# Patient Record
Sex: Male | Born: 1975 | Race: Black or African American | Hispanic: No | Marital: Single | Smoking: Current every day smoker
Health system: Southern US, Community
[De-identification: ages and names within clinical notes are randomized; demographics above are authoritative.]

## PROBLEM LIST (undated history)

## (undated) DIAGNOSIS — A3790 Whooping cough, unspecified species without pneumonia: Secondary | ICD-10-CM

---

## 2013-07-20 ENCOUNTER — Emergency Department (HOSPITAL_BASED_OUTPATIENT_CLINIC_OR_DEPARTMENT_OTHER): Payer: Worker's Compensation

## 2013-07-20 ENCOUNTER — Emergency Department (HOSPITAL_BASED_OUTPATIENT_CLINIC_OR_DEPARTMENT_OTHER)
Admission: EM | Admit: 2013-07-20 | Discharge: 2013-07-20 | Disposition: A | Discharge status: Home / Self Care | Payer: Worker's Compensation | Attending: Emergency Medicine | Admitting: Emergency Medicine

## 2013-07-20 ENCOUNTER — Encounter (HOSPITAL_BASED_OUTPATIENT_CLINIC_OR_DEPARTMENT_OTHER): Payer: Self-pay | Admitting: Emergency Medicine

## 2013-07-20 DIAGNOSIS — H538 Other visual disturbances: Secondary | ICD-10-CM | POA: Insufficient documentation

## 2013-07-20 DIAGNOSIS — S02401A Maxillary fracture, unspecified, initial encounter for closed fracture: Principal | ICD-10-CM | POA: Insufficient documentation

## 2013-07-20 DIAGNOSIS — S02609A Fracture of mandible, unspecified, initial encounter for closed fracture: Secondary | ICD-10-CM | POA: Insufficient documentation

## 2013-07-20 DIAGNOSIS — F17200 Nicotine dependence, unspecified, uncomplicated: Secondary | ICD-10-CM | POA: Insufficient documentation

## 2013-07-20 DIAGNOSIS — S025XXA Fracture of tooth (traumatic), initial encounter for closed fracture: Secondary | ICD-10-CM | POA: Insufficient documentation

## 2013-07-20 DIAGNOSIS — Y939 Activity, unspecified: Secondary | ICD-10-CM | POA: Insufficient documentation

## 2013-07-20 DIAGNOSIS — S032XXA Dislocation of tooth, initial encounter: Secondary | ICD-10-CM

## 2013-07-20 DIAGNOSIS — S0240CA Maxillary fracture, right side, initial encounter for closed fracture: Secondary | ICD-10-CM

## 2013-07-20 DIAGNOSIS — S02400A Malar fracture unspecified, initial encounter for closed fracture: Secondary | ICD-10-CM | POA: Insufficient documentation

## 2013-07-20 DIAGNOSIS — IMO0002 Reserved for concepts with insufficient information to code with codable children: Secondary | ICD-10-CM | POA: Insufficient documentation

## 2013-07-20 DIAGNOSIS — Y9289 Other specified places as the place of occurrence of the external cause: Secondary | ICD-10-CM | POA: Insufficient documentation

## 2013-07-20 DIAGNOSIS — Y990 Civilian activity done for income or pay: Secondary | ICD-10-CM | POA: Insufficient documentation

## 2013-07-20 MED ORDER — OXYCODONE-ACETAMINOPHEN 5-325 MG PO TABS: 1.0000 | ORAL_TABLET | Freq: Four times a day (QID) | ORAL | Status: DC | PRN

## 2013-07-20 MED ORDER — MORPHINE SULFATE 4 MG/ML IJ SOLN
4.0000 mg | Freq: Once | INTRAMUSCULAR | Status: AC
  Administered 2013-07-20: 4 mg via INTRAMUSCULAR
  Filled 2013-07-20: qty 1

## 2013-07-20 NOTE — ED Notes (Signed)
Patient had a metal bar hit him on the right side of his face yesterday at work,  [facility] and sent home with a dx of toothpain.  Patients right teeth are knocked inward and chipped as a result of accident. Right side of face is swollen, nose and right eye also swollen. Nose feel swollen, but pt is able to breath well. Patient reports a blind spot in his right eye that is new for him.

## 2013-07-20 NOTE — ED Provider Notes (Signed)
CSN: [id number]     Arrival date & time 07/20/13  1334 History   First MD Initiated Contact with Patient 07/20/13 1337     Chief Complaint  Patient presents with  . Facial Injury   (Consider location/radiation/quality/duration/timing/severity/associated sxs/prior Treatment) HPI Comments: Pt is a 38 y/o male who presents to the ED complaining of worsening facial pain after being hit on the right side of his face with a metal bar while at work yesterday around 9:00 am. He went to [facility] hospital and was told he had tooth pain, sent home with penicillin and vicodin. Pt states his face has become swollen, his teeth are loose and has a quick episode of blurred vision in his right eye only when he looks to the right. Denies confusion, difficulty breathing, weakness, ear drainage, eye pain or eye movement pain.  Patient is a 38 y.o. male presenting with facial injury. The history is provided by the patient.  Facial Injury   History reviewed. No pertinent past medical history. History reviewed. No pertinent past surgical history. History reviewed. No pertinent family history. History  Substance Use Topics  . Smoking status: Current Every Day Smoker  . Smokeless tobacco: Not on file  . Alcohol Use: Yes     Comment: social    Review of Systems  HENT: Positive for dental problem and facial swelling.   Eyes: Positive for visual disturbance.  Skin: Positive for wound.  All other systems reviewed and are negative.    Allergies  Review of patient's allergies indicates not on file.  Home Medications   Current Outpatient Rx  Name  Route  Sig  Dispense  Refill  . oxyCODONE-acetaminophen (PERCOCET) 5-325 MG per tablet   Oral   Take 1-2 tablets by mouth every 6 (six) hours as needed for severe pain.   15 tablet   0    BP 145/84  Pulse 87  Temp(Src) 98.2 F (36.8 C) (Oral)  Resp 20  Ht 5\' 10"  (1.778 m)  Wt 160 lb (72.576 kg)  BMI 22.96 kg/m2  SpO2 100% Physical Exam  Nursing note and  vitals reviewed. Constitutional: He is oriented to person, place, and time. He appears well-developed and well-nourished. No distress.  HENT:  Head: Head is without raccoon's eyes and without Battle's sign.    Right Ear: No hemotympanum.  Left Ear: No hemotympanum.  Nose: No sinus tenderness, nasal deformity or septal deviation.  Mouth/Throat: Oropharynx is clear and moist.    Mild right sided facial swelling. No tenderness around orbits. 4 upper right teeth marked in graphical image loose and tender. Nares patent.  Eyes: Conjunctivae and EOM are normal. Pupils are equal, round, and reactive to light.  No pain with eye movement.  Neck: Normal range of motion. Neck supple.  Cardiovascular: Normal rate, regular rhythm and normal heart sounds.   Pulmonary/Chest: Effort normal and breath sounds normal. No respiratory distress.  Musculoskeletal: Normal range of motion. He exhibits no edema.  Neurological: He is alert and oriented to person, place, and time.  Skin: Skin is warm and dry. He is not diaphoretic.  Psychiatric: He has a normal mood and affect. His behavior is normal.    ED Course  Procedures (including critical care time) Labs Review Labs Reviewed - No data to display Imaging Review Ct Maxillofacial Wo Cm  07/20/2013   CLINICAL DATA:  Right-sided facial trauma, visual field disturbance and restricted range of maxillary motion.  EXAM: CT MAXILLOFACIAL WITHOUT CONTRAST  TECHNIQUE: Multidetector CT imaging  of the maxillofacial structures was performed. Multiplanar CT image reconstructions were also generated. A small metallic BB was placed on the right temple in order to reliably differentiate right from left.  COMPARISON:  None.  FINDINGS: Dental caries noted. There is an oblique fracture traversing the right antral lateral aspect of the maxilla, for example image 49. Dental malocclusion is noted. Nasal bones are unremarkable. Zygomatic arches and orbits are intact. Mild ethmoid and  maxillary sinusitis is noted. Mandibular condyles are properly located. Patient is partly edentulous. There is also a nondisplaced oblique fracture of the right antral lateral mandible. This is best seen on coronal suspect this is best seen on sagittal reformatted images for example image 37.  IMPRESSION: Anterolateral right maxillary fracture with partial avulsion of multiple teeth.  Oblique fracture through the anterior lateral right mandible.  Patient is partly edentulous with malocclusion and severe dental caries.  Sinusitis.   Electronically Signed   By: Christiana PellantGretchen  Green M.D.   On: 07/20/2013 14:35    EKG Interpretation   None       MDM   1. Closed fracture of right maxilla   2. Avulsion of tooth   3. Fracture of mandible, closed    Patient presenting with right-sided facial injury after a metal pole fell onto his face. Seen at [facility] yesterday, had a normal orthopantogram. CT maxillofacial showing anterolateral right maxillary fracture with partial avulsion of multiple teeth, oblique fracture through the anterior lateral right mandible. I spoke with Dr. Jeanice Limurham, on call for maxillofacial trauma who will see patient in the office tomorrow, advised pain medication and antibiotics. [facility] which Dr. Jeanice Limurham states is significant enough. Advised soft foods diet. Patient is stable for discharge. Return precautions given. Patient states understanding of treatment care plan and is agreeable. Case discussed with attending Dr. Bernette MayersSheldon who agrees with plan of care.     Trevor MaceRobyn M Albert, PA-C 07/20/13 1544

## 2013-07-20 NOTE — Discharge Instructions (Signed)
Take percocet for severe pain only. No driving or operating heavy machinery while taking percocet. This medication may cause drowsiness. Call Dr. Deirdre Peerurham's office tomorrow at 8:00 am, he is expecting you in the office.  Mandibular Fracture A mandibular fracture is a break in the jawbone. CAUSES  The most common cause of mandibular fracture is a direct blow (trauma) to the jaw. This could happen from:  A car crash.  Physical violence.  A fall from a high place. SYMPTOMS   Pain.  Swelling.  Difficulty and pain when closing the mouth.  Feeling that the teeth are not aligned properly when closing the mouth (malocclusion).  Difficulty speaking.  Difficulty swallowing. DIAGNOSIS  Your caregiver will take your history and perform a physical exam. He or she may also order imaging tests, such as X-rays or a computed tomography (CT) scan, to confirm your diagnosis. TREATMENT  Surgery is often needed to put the jaw back in the right position. Wires are usually placed around the teeth to hold the jaw in place while it heals. Treatment may also include pain medicine, ice, and a soft or liquid diet. HOME CARE INSTRUCTIONS   Put ice on the injured area.  Put ice in a plastic bag.  Place a towel between your skin and the bag.  Leave the ice on for 15-20 minutes, 03-04 times a day for the first 2 days.  Only take over-the-counter or prescription medicines for pain, discomfort, or fever as directed by your caregiver.  Eat a well-balanced, high-protein soft or liquid diet as directed by your caregiver.  If your jaws are wired, follow your caregiver's instructions for wired jaw care.  Sleep on your back to avoid putting pressure on your jaw.  Avoid exercising to the point that you become short of breath. SEEK MEDICAL CARE IF:   You have a severe headache or numbness in your face.  You have severe jaw pain that is not relieved with medicine.  Your jaw wires become loose.  You have  uncontrollable nausea or anxiety.  Your swelling or redness gets worse. SEEK IMMEDIATE MEDICAL CARE IF:  You have a fever.  You have difficulty breathing.  You feel like your airway is tightening.  You cannot swallow your saliva.  You make a high-pitched whistling sound when you breathe (wheezing). MAKE SURE YOU:   Understand these instructions.  Will watch your condition.  Will get help right away if you are not doing well or get worse. Document Released: 06/12/2005 Document Revised: 09/04/2011 Document Reviewed: 06/28/2011 [facility] Patient Information 2014 [place], [place].  Facial Fracture A facial fracture is a break in one of the bones of your face. HOME CARE INSTRUCTIONS   Protect the injured part of your face until it is healed.  Do not participate in activities which give chance for re-injury until your doctor approves.  Gently wash and dry your face.  Wear head and facial protection while riding a bicycle, motorcycle, or snowmobile. SEEK MEDICAL CARE IF:   An oral temperature above 102 F (38.9 C) develops.  You have severe headaches or notice changes in your vision.  You have new numbness or tingling in your face.  You develop nausea (feeling sick to your stomach), vomiting or a stiff neck. SEEK IMMEDIATE MEDICAL CARE IF:   You develop difficulty seeing or experience double vision.  You become dizzy, lightheaded, or faint.  You develop trouble speaking, breathing, or swallowing.  You have a watery discharge from your nose or ear. MAKE  SURE YOU:   Understand these instructions.  Will watch your condition.  Will get help right away if you are not doing well or get worse. Document Released: 06/12/2005 Document Revised: 09/04/2011 Document Reviewed: 01/30/2008 [facility] Patient Information 2014 [place], [place].  Dental Injury Your exam shows that you have injured your teeth. The treatment of broken teeth and other dental injuries depends on how  badly they are hurt. All dental injuries should be checked as soon as possible by a dentist if there are:  Loose teeth which may need to be wired or bonded with a plastic device to hold them in place.  Broken teeth with exposed tooth pulp which may cause a serious infection.  Painful teeth especially when you bite or chew.  Sharp tooth edges that cut your tongue or lips. Sometimes, antibiotics or pain medicine are prescribed to prevent infection and control pain. Eat a soft or liquid diet and rinse your mouth out after meals with warm water. You should see a dentist or return here at once if you have increased swelling, increased pain or uncontrolled bleeding from the site of your injury. SEEK MEDICAL CARE IF:   You have increased pain not controlled with medicines.  You have swelling around your tooth, in your face or neck.  You have bleeding which starts, continues, or gets worse.  You have a fever. Document Released: 06/12/2005 Document Revised: 09/04/2011 Document Reviewed: 06/11/2009 [facility] Patient Information 2014 [place], [place].

## 2013-07-22 NOTE — ED Provider Notes (Signed)
Medical screening examination/treatment/procedure(s) were performed by non-physician practitioner and as supervising physician I was immediately available for consultation/collaboration.  EKG Interpretation   None         Domique Clapper B. Bobi Daudelin, MD 07/22/13 2036 

## 2013-07-27 ENCOUNTER — Encounter (HOSPITAL_BASED_OUTPATIENT_CLINIC_OR_DEPARTMENT_OTHER): Payer: Self-pay | Admitting: Emergency Medicine

## 2013-07-27 ENCOUNTER — Emergency Department (HOSPITAL_BASED_OUTPATIENT_CLINIC_OR_DEPARTMENT_OTHER)
Admission: EM | Admit: 2013-07-27 | Discharge: 2013-07-27 | Disposition: A | Discharge status: Home / Self Care | Payer: Worker's Compensation | Attending: Emergency Medicine | Admitting: Emergency Medicine

## 2013-07-27 DIAGNOSIS — F17200 Nicotine dependence, unspecified, uncomplicated: Secondary | ICD-10-CM | POA: Insufficient documentation

## 2013-07-27 DIAGNOSIS — K5900 Constipation, unspecified: Secondary | ICD-10-CM

## 2013-07-27 MED ORDER — MAGNESIUM CITRATE PO SOLN: 1.0000 | Freq: Once | ORAL | Status: DC

## 2013-07-27 MED ORDER — FLEET ENEMA 7-19 GM/118ML RE ENEM
1.0000 | ENEMA | Freq: Once | RECTAL | Status: DC
  Filled 2013-07-27: qty 1

## 2013-07-27 NOTE — ED Provider Notes (Signed)
History/physical exam/procedure(s) were performed by non-physician practitioner and as supervising physician I was immediately available for consultation/collaboration. I have reviewed all notes and am in agreement with care and plan.   Hilario Quarryanielle S Anntonette Madewell, MD 07/27/13 1537

## 2013-07-27 NOTE — Discharge Instructions (Signed)
Constipation, Adult °Constipation is when a person: °· Poops (bowel movement) less than 3 times a week. °· Has a hard time pooping. °· Has poop that is dry, hard, or bigger than normal. °HOME CARE  °· Eat more fiber, such as fruits, vegetables, whole grains like brown rice, and beans. °· Eat less fatty foods and sugar. This includes French fries, hamburgers, cookies, candy, and soda. °· If you are not getting enough fiber from food, take products with added fiber in them (supplements). °· Drink enough fluid to keep your pee (urine) clear or pale yellow. °· Go to the restroom when you feel like you need to poop. Do not hold it. °· Only take medicine as told by your doctor. Do not take medicines that help you poop (laxatives) without talking to your doctor first. °· Exercise on a regular basis, or as told by your doctor. °GET HELP RIGHT AWAY IF:  °· You have bright red blood in your poop (stool). °· Your constipation lasts more than 4 days or gets worse. °· You have belly (abdomen) or butt (rectal) pain. °· You have thin poop (as thin as a pencil). °· You lose weight, and it cannot be explained. °MAKE SURE YOU:  °· Understand these instructions. °· Will watch your condition. °· Will get help right away if you are not doing well or get worse. °Document Released: 11/29/2007 Document Revised: 09/04/2011 Document Reviewed: 03/24/2013 °ExitCare® Patient Information ©2014 [employer], LLC. ° °Fiber Content in Foods °Drinking plenty of fluids and consuming foods high in fiber can help with constipation. See the list below for the fiber content of some common foods. °Starches and Grains / Dietary Fiber (g) °· Cheerios, 1 cup / 3 g °· Kellogg's Corn Flakes, 1 cup / 0.7 g °· Rice Krispies, 1 ¼ cup / 0.3 g °· Quaker Oat Life Cereal, ¾ cup / 2.1 g °· Oatmeal, instant (cooked), ½ cup / 2 g °· Kellogg's Frosted Mini Wheats, 1 cup / 5.1 g °· Rice, brown, long-grain (cooked), 1 cup / 3.5 g °· Rice, white, long-grain (cooked), 1 cup /  0.6 g °· Macaroni, cooked, enriched, 1 cup / 2.5 g °Legumes / Dietary Fiber (g) °· Beans, baked, canned, plain or vegetarian, ½ cup / 5.2 g °· Beans, kidney, canned, ½ cup / 6.8 g °· Beans, pinto, dried (cooked), ½ cup / 7.7 g °· Beans, pinto, canned, ½ cup / 5.5 g °Breads and Crackers / Dietary Fiber (g) °· Graham crackers, plain or honey, 2 squares / 0.7 g °· Saltine crackers, 3 squares / 0.3 g °· Pretzels, plain, salted, 10 pieces / 1.8 g °· Bread, whole-wheat, 1 slice / 1.9 g °· Bread, white, 1 slice / 0.7 g °· Bread, raisin, 1 slice / 1.2 g °· Bagel, plain, 3 oz / 2 g °· Tortilla, flour, 1 oz / 0.9 g °· Tortilla, corn, 1 small / 1.5 g °· Bun, hamburger or hotdog, 1 small / 0.9 g °Fruits / Dietary Fiber (g) °· Apple, raw with skin, 1 medium / 4.4 g °· Applesauce, sweetened, ½ cup / 1.5 g °· Banana, ½ medium / 1.5 g °· Grapes, 10 grapes / 0.4 g °· Orange, 1 small / 2.3 g °· Raisin, 1.5 oz / 1.6 g °· Melon, 1 cup / 1.4 g °Vegetables / Dietary Fiber (g) °· Green beans, canned, ½ cup / 1.3 g °· Carrots (cooked), ½ cup / 2.3 g °· Broccoli (cooked), ½ cup / 2.8 g °· Peas,   frozen (cooked), ½ cup / 4.4 g °· Potatoes, mashed, ½ cup / 1.6 g °· Lettuce, 1 cup / 0.5 g °· Corn, canned, ½ cup / 1.6 g °· Tomato, ½ cup / 1.1 g °Document Released: 10/29/2006 Document Revised: 09/04/2011 Document Reviewed: 12/24/2006 °ExitCare® Patient Information ©2014 ExitCare, LLC. ° °

## 2013-07-27 NOTE — ED Provider Notes (Signed)
CSN: [id number]     Arrival date & time 07/27/13  1115 History   First MD Initiated Contact with Patient 07/27/13 1127     Chief Complaint  Patient presents with  . Constipation   (Consider location/radiation/quality/duration/timing/severity/associated sxs/prior Treatment) Patient is a 38 y.o. male presenting with constipation. The history is provided by the patient.  Constipation Severity:  Mild Time since last bowel movement:  1 week Timing:  Constant Progression:  Worsening Chronicity:  New Context: medication   Stool description:  None produced Unusual stool frequency:  Daily Relieved by:  None tried Worsened by:  Prescription drugs Ineffective treatments:  None tried Associated symptoms: no anorexia, no back pain, no diarrhea, no dysuria, no fever, no nausea and no vomiting  Abdominal pain: mild cramping.    COVEY BALLER is a 38 y.o. male who presents to the ED with constipation that started after he was taking narcotic pain medication for a facial injury. He has not had a BM in 6 days. He has not taken a stool softener or anything to try to help with the constipation.  History reviewed. No pertinent past medical history. History reviewed. No pertinent past surgical history. No family history on file. History  Substance Use Topics  . Smoking status: Current Every Day Smoker  . Smokeless tobacco: Not on file  . Alcohol Use: Yes     Comment: social    Review of Systems  Constitutional: Negative for fever and chills.  Gastrointestinal: Positive for constipation. Negative for nausea, vomiting, diarrhea and anorexia. Abdominal pain: mild cramping.  Genitourinary: Negative for dysuria.  Musculoskeletal: Negative for back pain.  Skin: Negative for rash.  Neurological: Negative for dizziness.  Psychiatric/Behavioral: The patient is not nervous/anxious.     Allergies  Review of patient's allergies indicates no known allergies.  Home Medications   Current Outpatient Rx   Name  Route  Sig  Dispense  Refill  . oxyCODONE-acetaminophen (PERCOCET) 5-325 MG per tablet   Oral   Take 1-2 tablets by mouth every 6 (six) hours as needed for severe pain.   15 tablet   0    BP 115/71  Pulse 72  Temp(Src) 98 F (36.7 C) (Oral)  Resp 18  SpO2 100% Physical Exam  Nursing note and vitals reviewed. Constitutional: He is oriented to person, place, and time. He appears well-developed and well-nourished. No distress.  Eyes: EOM are normal.  Neck: Neck supple.  Cardiovascular: Normal rate and regular rhythm.   Pulmonary/Chest: Effort normal. He has no wheezes. He has no rales.  Abdominal: Soft. Bowel sounds are normal. There is no tenderness.  Genitourinary: Rectal exam shows no external hemorrhoid, no internal hemorrhoid, no mass and no tenderness.  Large amount of stool palpated on exam.   Musculoskeletal: Normal range of motion.  Neurological: He is alert and oriented to person, place, and time. No cranial nerve deficit.  Skin: Skin is warm and dry.  Psychiatric: He has a normal mood and affect. His behavior is normal.    ED Course  Procedures ( Offered Fleet enema but patient declined. Request trial of OTC medication first.  MDM  38 y.o. male with constipation x 1 week after taking oral narcotic pain medication. Will treat with medication and diet. He will return as needed if symptoms worsen.  Discussed with the patient and all questioned fully answered.   Medication List    TAKE these medications       magnesium citrate Soln  Take 296 mLs (  1 Bottle total) by mouth once.      ASK your doctor about these medications       oxyCODONE-acetaminophen 5-325 MG per tablet  Commonly known as:  PERCOCET  Take 1-2 tablets by mouth every 6 (six) hours as needed for severe pain.         [facility] Orlene OchM [place], [place]NP 07/27/13 1230

## 2013-07-27 NOTE — ED Notes (Addendum)
Patient stated that he would like to try something by mouth before getting an enema. NP made aware.

## 2013-07-27 NOTE — ED Notes (Signed)
Patient was here last week for facial injuries and was prescribed pain medication. States that he has not a bowel movement since Monday.

## 2014-02-09 IMAGING — CT CT MAXILLOFACIAL W/O CM
3 series · 16 of 47 positions shown, 19 images · non-contrast
Comparison: None.

CLINICAL DATA: Right-sided facial trauma, visual field disturbance
and restricted range of maxillary motion.

EXAM:
CT MAXILLOFACIAL WITHOUT CONTRAST
TECHNIQUE: Multidetector CT imaging of the maxillofacial structures was
performed. Multiplanar CT image reconstructions were also generated.
A small metallic BB was placed on the right temple in order to
reliably differentiate right from left.

[Series 3: maxillofacial 2.0 h30s st · axial · 0.38mm/px · z∈[-222,-46]mm · 10 of 102 slices shown, 13 images]
[im 7/102  brain]
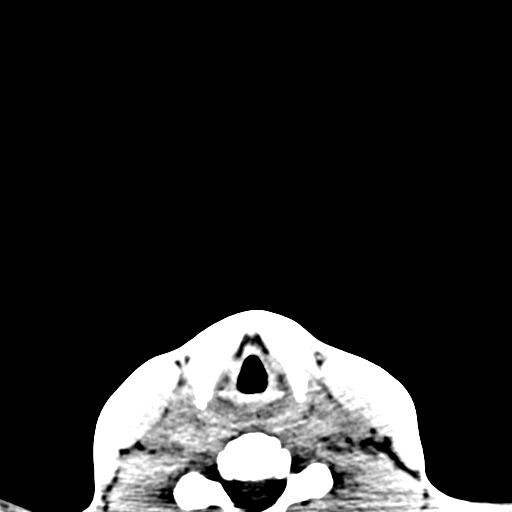
[im 7/102  bone]
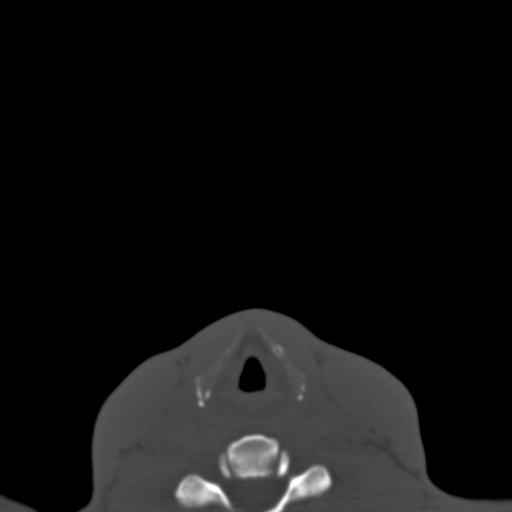
[im 18/102  bone]
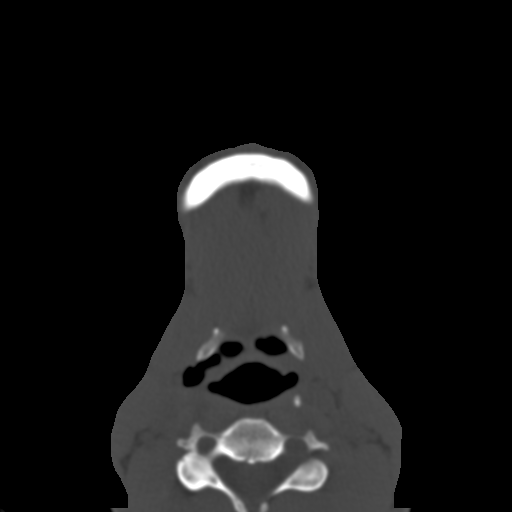
[im 28/102  bone]
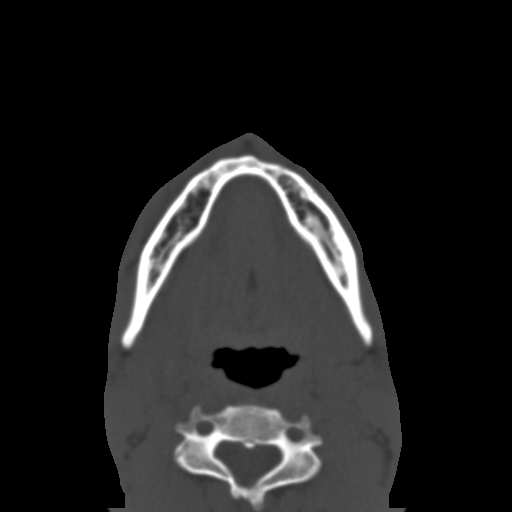
[im 35/102  bone]
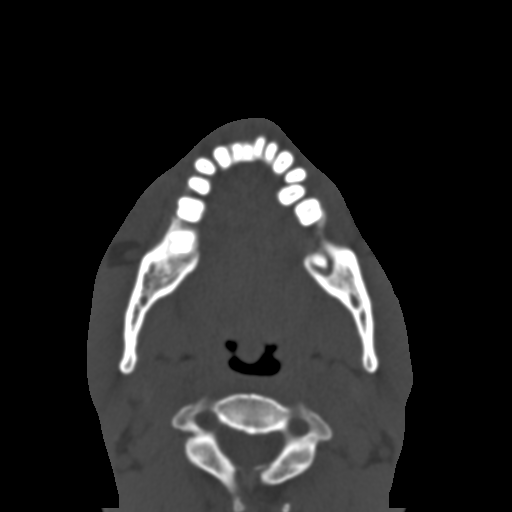
[im 46/102  brain]
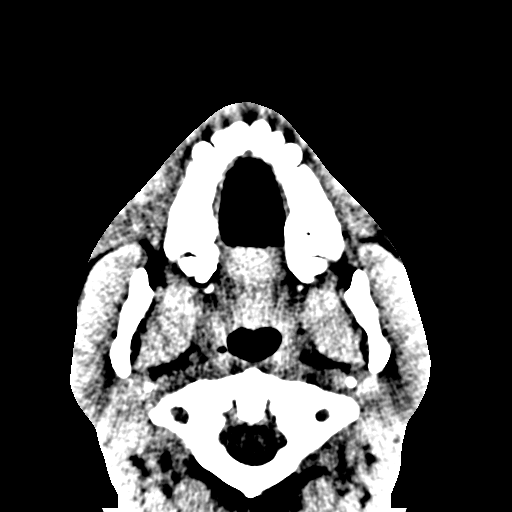
[im 46/102  bone]
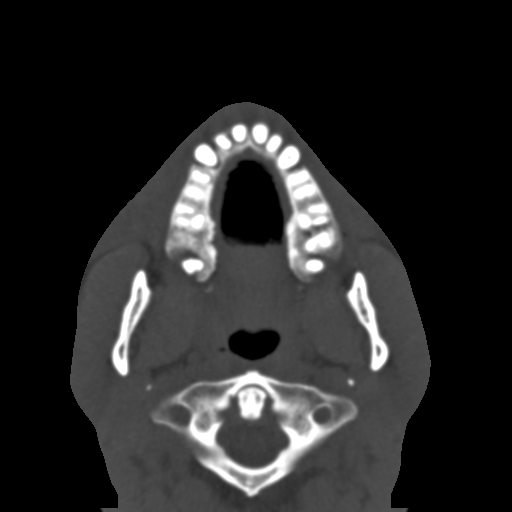
[im 56/102  bone]
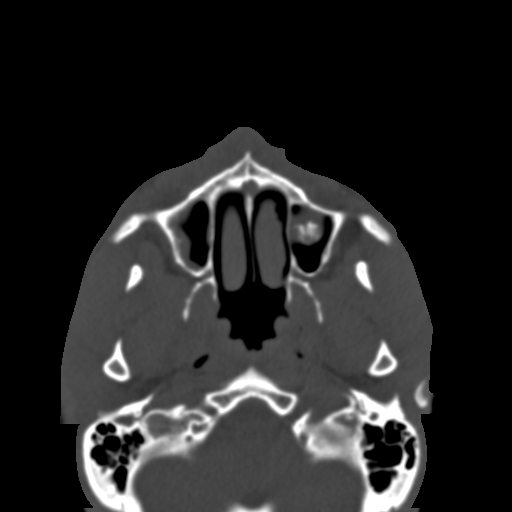
[im 67/102  bone]
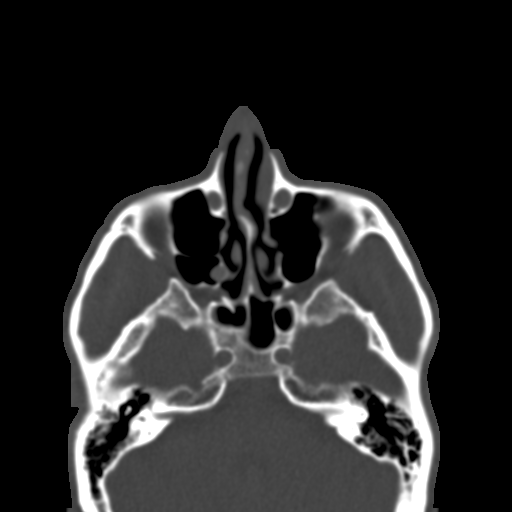
[im 77/102  bone]
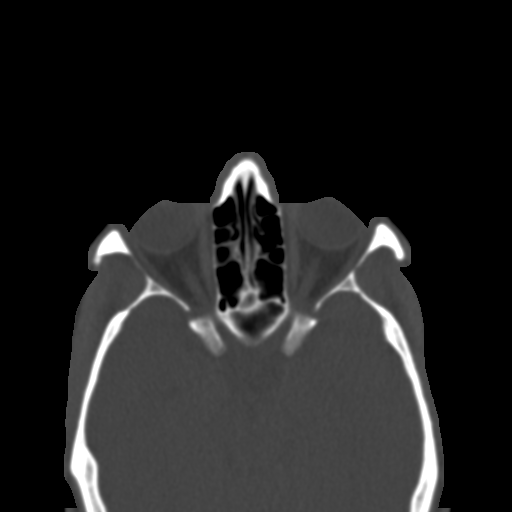
[im 84/102  brain]
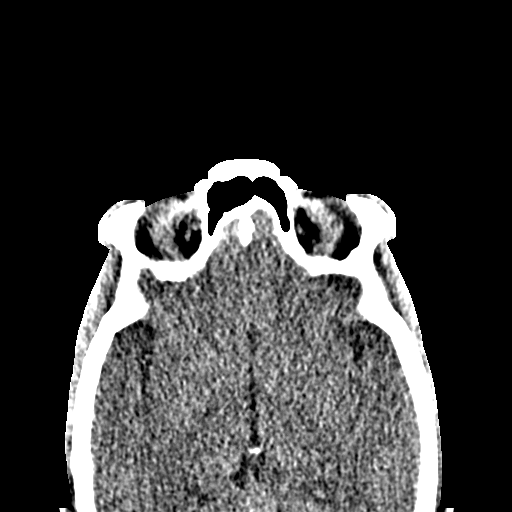
[im 84/102  bone]
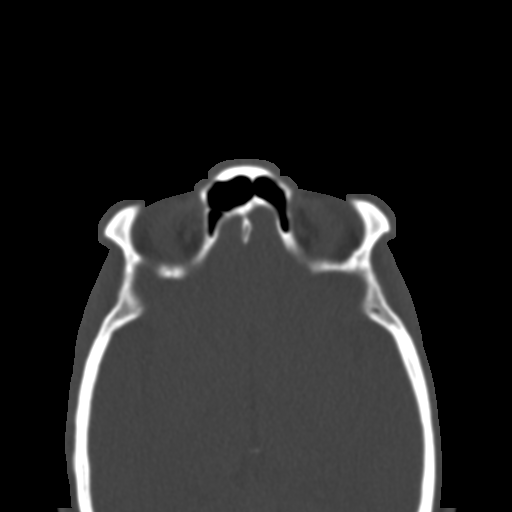
[im 95/102  bone]
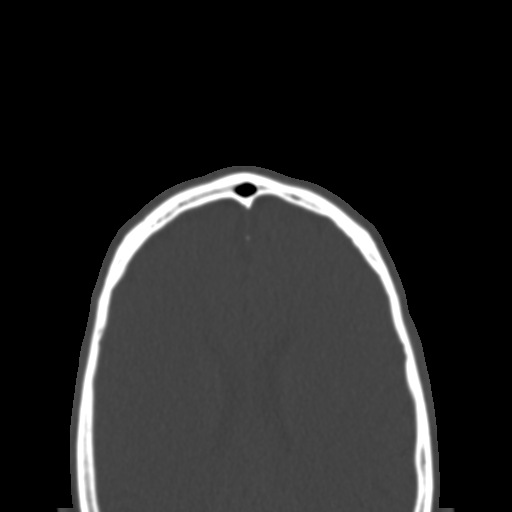

[Series 8: maxillofacial 2.0 coronal · coronal · 0.46mm/px · 3 of 80 slices shown]
[im 27/80  bone]
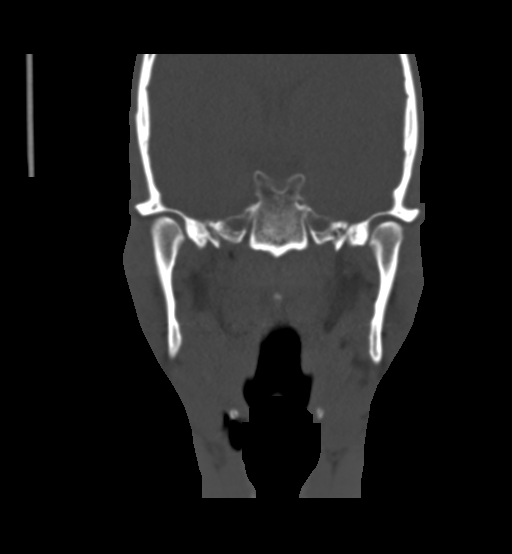
[im 36/80  bone]
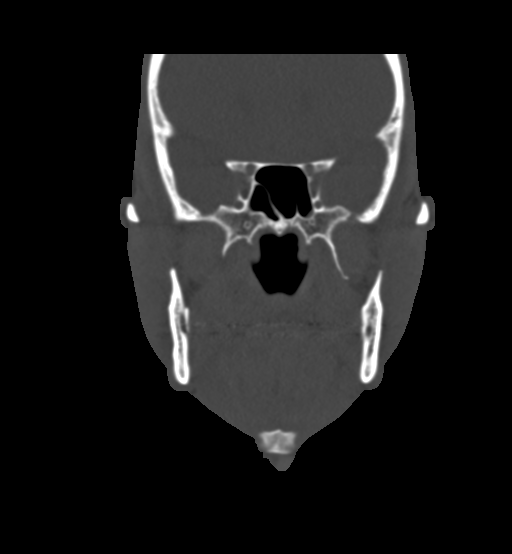
[im 44/80  bone]
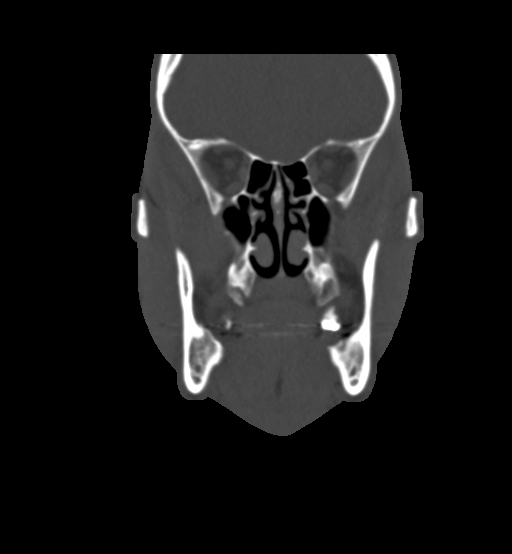

[Series 9: maxillofacial 2.0 sagittal · sagittal · 0.51mm/px · 3 of 79 slices shown]
[im 27/79  bone]
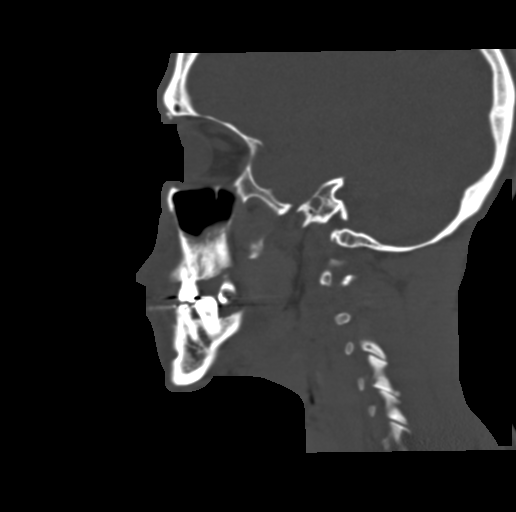
[im 40/79  bone]
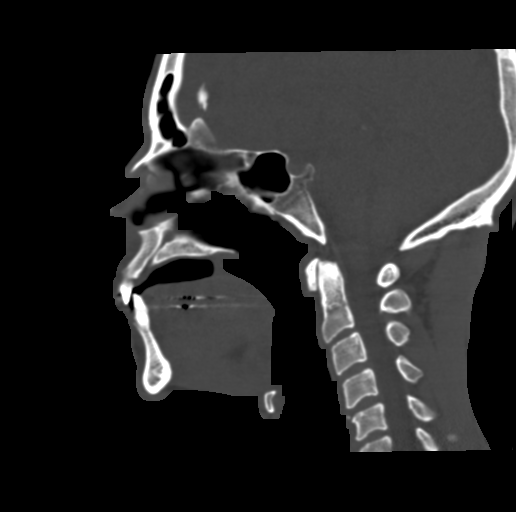
[im 53/79  bone]
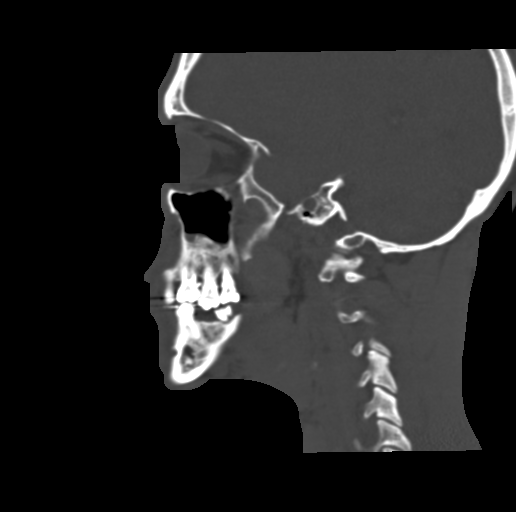

[16 of 47 positions shown; findings below may reference images not displayed]

FINDINGS: Dental caries noted. There is an oblique fracture traversing the
right antral lateral aspect of the maxilla, for example image 49.
Dental malocclusion is noted. Nasal bones are unremarkable.
Zygomatic arches and orbits are intact. Mild ethmoid and maxillary
sinusitis is noted. Mandibular condyles are properly located.
Patient is partly edentulous. There is also a nondisplaced oblique
fracture of the right antral lateral mandible. This is best seen on
coronal suspect this is best seen on sagittal reformatted images for
example image 37.
IMPRESSION: Anterolateral right maxillary fracture with partial avulsion of
multiple teeth.

Oblique fracture through the anterior lateral right mandible.

Patient is partly edentulous with malocclusion and severe dental
caries.

Sinusitis.

## 2014-03-04 ENCOUNTER — Encounter (HOSPITAL_BASED_OUTPATIENT_CLINIC_OR_DEPARTMENT_OTHER): Payer: Self-pay | Admitting: Emergency Medicine

## 2014-03-04 ENCOUNTER — Emergency Department (HOSPITAL_BASED_OUTPATIENT_CLINIC_OR_DEPARTMENT_OTHER)
Admission: EM | Admit: 2014-03-04 | Discharge: 2014-03-04 | Disposition: A | Discharge status: Home / Self Care | Payer: BC Managed Care – PPO | Attending: Emergency Medicine | Admitting: Emergency Medicine

## 2014-03-04 DIAGNOSIS — K589 Irritable bowel syndrome without diarrhea: Secondary | ICD-10-CM | POA: Insufficient documentation

## 2014-03-04 DIAGNOSIS — R109 Unspecified abdominal pain: Secondary | ICD-10-CM | POA: Diagnosis present

## 2014-03-04 DIAGNOSIS — F17200 Nicotine dependence, unspecified, uncomplicated: Secondary | ICD-10-CM | POA: Diagnosis not present

## 2014-03-04 LAB — CBC WITH DIFFERENTIAL/PLATELET
BASOS PCT: 0 % (ref 0–1)
Basophils Absolute: 0 10*3/uL (ref 0.0–0.1)
Eosinophils Absolute: 0.1 10*3/uL (ref 0.0–0.7)
Eosinophils Relative: 1 % (ref 0–5)
HCT: 39.4 % (ref 39.0–52.0)
HEMOGLOBIN: 13.5 g/dL (ref 13.0–17.0)
LYMPHS PCT: 21 % (ref 12–46)
Lymphs Abs: 2 10*3/uL (ref 0.7–4.0)
MCH: 27.5 pg (ref 26.0–34.0)
MCHC: 34.3 g/dL (ref 30.0–36.0)
MCV: 80.2 fL (ref 78.0–100.0)
MONO ABS: 0.6 10*3/uL (ref 0.1–1.0)
MONOS PCT: 7 % (ref 3–12)
NEUTROS ABS: 6.6 10*3/uL (ref 1.7–7.7)
NEUTROS PCT: 71 % (ref 43–77)
Platelets: 252 10*3/uL (ref 150–400)
RBC: 4.91 MIL/uL (ref 4.22–5.81)
RDW: 15.3 % (ref 11.5–15.5)
WBC: 9.2 10*3/uL (ref 4.0–10.5)

## 2014-03-04 LAB — COMPREHENSIVE METABOLIC PANEL
ALBUMIN: 4.1 g/dL (ref 3.5–5.2)
ALK PHOS: 48 U/L (ref 39–117)
ALT: 11 U/L (ref 0–53)
ANION GAP: 11 (ref 5–15)
AST: 16 U/L (ref 0–37)
BUN: 13 mg/dL (ref 6–23)
CO2: 28 mEq/L (ref 19–32)
Calcium: 9.6 mg/dL (ref 8.4–10.5)
Chloride: 101 mEq/L (ref 96–112)
Creatinine, Ser: 1.1 mg/dL (ref 0.50–1.35)
GFR calc Af Amer: >90 mL/min (ref >90)
GFR calc non Af Amer: 82 mL/min — ABNORMAL LOW (ref >90)
Glucose, Bld: 91 mg/dL (ref 70–99)
POTASSIUM: 4 meq/L (ref 3.7–5.3)
Sodium: 140 mEq/L (ref 137–147)
Total Bilirubin: 0.5 mg/dL (ref 0.3–1.2)
Total Protein: 7 g/dL (ref 6.0–8.3)

## 2014-03-04 MED ORDER — PROMETHAZINE HCL 25 MG PO TABS: 25.0000 mg | ORAL_TABLET | Freq: Four times a day (QID) | ORAL | Status: DC | PRN

## 2014-03-04 NOTE — ED Provider Notes (Signed)
CSN: [id number]     Arrival date & time 03/04/14  1112 History   First MD Initiated Contact with Patient 03/04/14 1124     Chief Complaint  Patient presents with  . Abdominal Cramping     HPI Patient presents with some abdominal cramping nausea and loose runny bowel movements for the last week or 2.  Patient denies any constipation.  Denies fever chills.  Denies melena or hematochezia.    History reviewed. No pertinent past medical history. History reviewed. No pertinent past surgical history. No family history on file. History  Substance Use Topics  . Smoking status: Current Every Day Smoker  . Smokeless tobacco: Not on file  . Alcohol Use: Yes     Comment: social    Review of Systems  All other systems reviewed and are negative   Allergies  Review of patient's allergies indicates no known allergies.  Home Medications   Prior to Admission medications   Medication Sig Start Date End Date Taking? Authorizing Provider  magnesium citrate SOLN Take 296 mLs (1 Bottle total) by mouth once. 07/27/13   Hope Orlene Och, NP  oxyCODONE-acetaminophen (PERCOCET) 5-325 MG per tablet Take 1-2 tablets by mouth every 6 (six) hours as needed for severe pain. 07/20/13   Trevor Mace, PA-C  promethazine (PHENERGAN) 25 MG tablet Take 1 tablet (25 mg total) by mouth every 6 (six) hours as needed for nausea or vomiting. 03/04/14   Nelia Shi, MD   BP 109/69  Pulse 85  Temp(Src) 97.9 F (36.6 C) (Oral)  Resp 18  Ht  (1.778 m)  Wt 157 lb (71.215 kg)  BMI 22.53 kg/m2  SpO2 100% Physical Exam Physical Exam  Nursing note and vitals reviewed. Constitutional: He is oriented to person, place, and time. He appears well-developed and well-nourished. No distress.  HENT:  Head: Normocephalic and atraumatic.  Eyes: Pupils are equal, round, and reactive to light.  Neck: Normal range of motion.  Cardiovascular: Normal rate and intact distal pulses.   Pulmonary/Chest: No respiratory distress.   Abdominal: Normal appearance. He exhibits no distension.  Musculoskeletal: Normal range of motion.  Neurological: He is alert and oriented to person, place, and time. No cranial nerve deficit.  Skin: Skin is warm and dry. No rash noted.  Psychiatric: He has a normal mood and affect. His behavior is normal.   ED Course  Procedures (including critical care time) Labs Review Labs Reviewed  COMPREHENSIVE METABOLIC PANEL - Abnormal; Notable for the following:    GFR calc non Af Amer 82 (*)    All other components within normal limits  CBC WITH DIFFERENTIAL         MDM   Final diagnoses:  Irritable bowel        Nelia Shi, MD 03/04/14 1221

## 2014-03-04 NOTE — Discharge Instructions (Signed)
Diet and Irritable Bowel Syndrome  No cure has been found for irritable bowel syndrome (IBS). Many options are available to treat the symptoms. Your caregiver will give you the best treatments available for your symptoms. He or she will also encourage you to manage stress and to make changes to your diet. You need to work with your caregiver and Registered Dietician to find the best combination of medicine, diet, counseling, and support to control your symptoms. The following are some diet suggestions. FOODS THAT MAKE IBS WORSE  Fatty foods, such as [place] fries.  Milk products, such as cheese or ice cream.  Chocolate.  Alcohol.  Caffeine (found in coffee and some sodas).  Carbonated drinks, such as soda. If certain foods cause symptoms, you should eat less of them or stop eating them. FOOD JOURNAL   Keep a journal of the foods that seem to cause distress. Write down:  What you are eating during the day and when.  What problems you are having after eating.  When the symptoms occur in relation to your meals.  What foods always make you feel badly.  Take your notes with you to your caregiver to see if you should stop eating certain foods. FOODS THAT MAKE IBS BETTER Fiber reduces IBS symptoms, especially constipation, because it makes stools soft, bulky, and easier to pass. Fiber is found in bran, bread, cereal, beans, fruit, and vegetables. Examples of foods with fiber include:  FIBER ONE CEREALS AND PRODUCTS  Apples.  Peaches.  Pears.  Berries.  Figs.  Broccoli, raw.  Cabbage.  Carrots.  Raw peas.  Kidney beans.  Lima beans.  Whole-grain bread.  Whole-grain cereal. Add foods with fiber to your diet a little at a time. This will let your body get used to them. Too much fiber at once might cause gas and swelling of your abdomen. This can trigger symptoms in a person with IBS. Caregivers usually recommend a diet with enough fiber to produce soft, painless bowel  movements. High fiber diets may cause gas and bloating. However, these symptoms often go away within a few weeks, as your body adjusts. In many cases, dietary fiber may lessen IBS symptoms, particularly constipation. However, it may not help pain or diarrhea. High fiber diets keep the colon mildly enlarged (distended) with the added fiber. This may help prevent spasms in the colon. Some forms of fiber also keep water in the stool, thereby preventing hard stools that are difficult to pass.  Besides telling you to eat more foods with fiber, your caregiver may also tell you to get more fiber by taking a fiber pill or drinking water mixed with a special high fiber powder. An example of this is a natural fiber laxative containing psyllium seed.  TIPS  Large meals can cause cramping and diarrhea in people with IBS. If this happens to you, try eating 4 or 5 small meals a day, or try eating less at each of your usual 3 meals. It may also help if your meals are low in fat and high in carbohydrates. Examples of carbohydrates are pasta, rice, whole-grain breads and cereals, fruits, and vegetables.  If dairy products cause your symptoms to flare up, you can try eating less of those foods. You might be able to handle yogurt better than other dairy products, because it contains bacteria that helps with digestion. Dairy products are an important source of calcium and other nutrients. If you need to avoid dairy products, be sure to talk with a  Registered Dietitian about getting these nutrients through other food sources.  Drink enough water and fluids to keep your urine clear or pale yellow. This is important, especially if you have diarrhea. FOR MORE INFORMATION  International Foundation for Functional Gastrointestinal Disorders: www.iffgd.org  National Digestive Diseases Information Clearinghouse: digestive.StageSync.si Document Released: 09/02/2003 Document Revised: 09/04/2011 Document Reviewed:  09/12/2013 [facility] Patient Information 2015 [place], [place]. This information is not intended to replace advice given to you by your health care provider. Make sure you discuss any questions you have with your health care provider.

## 2014-03-04 NOTE — ED Notes (Signed)
MD at bedside. 

## 2014-03-04 NOTE — ED Notes (Signed)
C/o abdominal cramping, nausea and constipation.

## 2014-07-23 ENCOUNTER — Encounter (HOSPITAL_BASED_OUTPATIENT_CLINIC_OR_DEPARTMENT_OTHER): Payer: Self-pay

## 2014-07-23 ENCOUNTER — Emergency Department (HOSPITAL_BASED_OUTPATIENT_CLINIC_OR_DEPARTMENT_OTHER)
Admission: EM | Admit: 2014-07-23 | Discharge: 2014-07-23 | Disposition: A | Discharge status: Home / Self Care | Payer: BLUE CROSS/BLUE SHIELD | Attending: Emergency Medicine | Admitting: Emergency Medicine

## 2014-07-23 DIAGNOSIS — Z8619 Personal history of other infectious and parasitic diseases: Secondary | ICD-10-CM | POA: Diagnosis not present

## 2014-07-23 DIAGNOSIS — J40 Bronchitis, not specified as acute or chronic: Secondary | ICD-10-CM | POA: Diagnosis not present

## 2014-07-23 DIAGNOSIS — Z79899 Other long term (current) drug therapy: Secondary | ICD-10-CM | POA: Insufficient documentation

## 2014-07-23 DIAGNOSIS — Z720 Tobacco use: Secondary | ICD-10-CM | POA: Insufficient documentation

## 2014-07-23 DIAGNOSIS — R05 Cough: Secondary | ICD-10-CM | POA: Diagnosis present

## 2014-07-23 HISTORY — DX: Whooping cough, unspecified species without pneumonia: A37.90

## 2014-07-23 MED ORDER — ALBUTEROL SULFATE HFA 108 (90 BASE) MCG/ACT IN AERS
2.0000 | INHALATION_SPRAY | RESPIRATORY_TRACT | Status: DC | PRN
  Administered 2014-07-23: 2 via RESPIRATORY_TRACT
  Filled 2014-07-23: qty 6.7

## 2014-07-23 NOTE — Discharge Instructions (Signed)

## 2014-07-23 NOTE — ED Notes (Signed)
Pt c/o cough x4days, worse at night

## 2014-07-23 NOTE — ED Provider Notes (Signed)
CSN: [id number]     Arrival date & time 07/23/14  0114 History   First MD Initiated Contact with Patient 07/23/14 0133     Chief Complaint  Patient presents with  . Cough      HPI Patient ports 3-4 days of productive cough.  He denies fevers or chills.  No shortness of breath.  He doesn't smoke cigarettes daily.  He states his cough is somewhat productive.  He reports is worse at night.  No burning sensation in his chest.  Not worse after food.  Patient is currently without a primary care physician.  He is concerned that this could represent "whooping cough".   Past Medical History  Diagnosis Date  . Whooping cough    History reviewed. No pertinent past surgical history. No family history on file. History  Substance Use Topics  . Smoking status: Current Every Day Smoker  . Smokeless tobacco: Not on file  . Alcohol Use: Yes     Comment: social    Review of Systems  All other systems reviewed and are negative.     Allergies  Review of patient's allergies indicates no known allergies.  Home Medications   Prior to Admission medications   Medication Sig Start Date End Date Taking? Authorizing Provider  magnesium citrate SOLN Take 296 mLs (1 Bottle total) by mouth once. 07/27/13   Hope Orlene OchM Neese, NP  oxyCODONE-acetaminophen (PERCOCET) 5-325 MG per tablet Take 1-2 tablets by mouth every 6 (six) hours as needed for severe pain. 07/20/13   Kathrynn Speedobyn M Hess, PA-C  promethazine (PHENERGAN) 25 MG tablet Take 1 tablet (25 mg total) by mouth every 6 (six) hours as needed for nausea or vomiting. 03/04/14   Nelia Shiobert L Beaton, MD   BP 132/86 mmHg  Pulse 78  Temp(Src) 98.2 F (36.8 C) (Oral)  Resp 18  Ht 5\' 10"  (1.778 m)  Wt 155 lb (70.308 kg)  BMI 22.24 kg/m2  SpO2 100% Physical Exam  Constitutional: He is oriented to person, place, and time. He appears well-developed and well-nourished.  HENT:  Head: Normocephalic and atraumatic.  Eyes: EOM are normal.  Neck: Normal range of motion.   Cardiovascular: Normal rate, regular rhythm, normal heart sounds and intact distal pulses.   Pulmonary/Chest: Effort normal and breath sounds normal. No respiratory distress.  Abdominal: Soft. He exhibits no distension. There is no tenderness.  Musculoskeletal: Normal range of motion.  Neurological: He is alert and oriented to person, place, and time.  Skin: Skin is warm and dry.  Psychiatric: He has a normal mood and affect. Judgment normal.  Nursing note and vitals reviewed.   ED Course  Procedures (including critical care time) Labs Review Labs Reviewed - No data to display  Imaging Review No results found.   EKG Interpretation None      MDM   Final diagnoses:  Bronchitis    Overall the patient is well-appearing.  Vital signs are normal.  I suspect bronchitis.  Lungs are clear.  Doubt pneumonia.  Home with albuterol inhaler to help with bronchospasm.    Lyanne CoKevin M Amad Mau, MD 07/23/14 [phone]

## 2015-02-05 ENCOUNTER — Emergency Department (HOSPITAL_BASED_OUTPATIENT_CLINIC_OR_DEPARTMENT_OTHER): Payer: Self-pay

## 2015-02-05 ENCOUNTER — Emergency Department (HOSPITAL_BASED_OUTPATIENT_CLINIC_OR_DEPARTMENT_OTHER)
Admission: EM | Admit: 2015-02-05 | Discharge: 2015-02-05 | Disposition: A | Discharge status: Home / Self Care | Payer: Self-pay | Attending: Emergency Medicine | Admitting: Emergency Medicine

## 2015-02-05 ENCOUNTER — Encounter (HOSPITAL_BASED_OUTPATIENT_CLINIC_OR_DEPARTMENT_OTHER): Payer: Self-pay | Admitting: *Deleted

## 2015-02-05 DIAGNOSIS — M79644 Pain in right finger(s): Secondary | ICD-10-CM | POA: Insufficient documentation

## 2015-02-05 DIAGNOSIS — Z720 Tobacco use: Secondary | ICD-10-CM | POA: Insufficient documentation

## 2015-02-05 DIAGNOSIS — Z8619 Personal history of other infectious and parasitic diseases: Secondary | ICD-10-CM | POA: Insufficient documentation

## 2015-02-05 MED ORDER — IBUPROFEN 400 MG PO TABS
400.0000 mg | ORAL_TABLET | Freq: Once | ORAL | Status: AC
  Administered 2015-02-05: 400 mg via ORAL
  Filled 2015-02-05: qty 1

## 2015-02-05 MED ORDER — LIDOCAINE HCL (PF) 1 % IJ SOLN
5.0000 mL | Freq: Once | INTRAMUSCULAR | Status: DC
  Filled 2015-02-05: qty 5

## 2015-02-05 NOTE — Discharge Instructions (Signed)
Take Advil for pain as directed. Soak your finger in warm water 4 times daily for 30 minutes at a time. If continuing to have pain or not improving by next week call Dr.Ortmann to schedule office visit

## 2015-02-05 NOTE — ED Provider Notes (Signed)
CSN: [id number]     Arrival date & time 02/05/15  2035 History   None    Chief Complaint  Patient presents with  . 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 Pain not made better or worse by anything. He history himself with Tylenol with partial relief. He denies fever denies other associated symptoms. Nothing makes symptoms better or worse. Pain is moderate at present. Past Medical History  Diagnosis Date  . Whooping cough    History reviewed. No pertinent past surgical history. No family history on file. Social History  Substance Use Topics  . Smoking status: Current Every Day Smoker  . Smokeless tobacco: None  . Alcohol Use: Yes     Comment: social    Review of Systems  Constitutional: Negative.   Musculoskeletal: Positive for arthralgias.       Pain at right middle finger  Allergic/Immunologic: Negative.  Negative for immunocompromised state.      Allergies  Review of patient's allergies indicates no known allergies.  Home Medications   Prior to Admission medications   Medication Sig Start Date End Date Taking? Authorizing Provider  magnesium citrate SOLN Take 296 mLs (1 Bottle total) by mouth once. 07/27/13   Hope Orlene Och, NP  oxyCODONE-acetaminophen (PERCOCET) 5-325 MG per tablet Take 1-2 tablets by mouth every 6 (six) hours as needed for severe pain. 07/20/13   Kathrynn Speed, PA-C  promethazine (PHENERGAN) 25 MG tablet Take 1 tablet (25 mg total) by mouth every 6 (six) hours as needed for nausea or vomiting. 03/04/14   Nelva Nay, MD   BP 118/68 mmHg  Pulse 88  Temp(Src) 98.2 F (36.8 C) (Oral)  Resp 16  Ht  (1.778 m)  Wt 155 lb (70.308 kg)  BMI 22.24 kg/m2  SpO2 98% Physical Exam  Constitutional:  He appears well-developed and well-nourished. No distress.  HENT:  Head: Normocephalic and atraumatic.  Right Ear: External ear normal.  Left Ear: External ear normal.  Eyes: EOM are normal.  Neck: Neck supple.  Cardiovascular: Normal rate.   Pulmonary/Chest: No respiratory distress.  Abdominal: He exhibits distension.  Musculoskeletal:  Right hand no swelling no redness, middle finger he is mildly tender at distal phalanx. Good capillary refill. Full range of motion. All other fingers without redness swelling or tenderness neurovascular intact. All other extremities without redness or tenderness neurovascularly intact  Nursing note and vitals reviewed.   ED Course  Procedures (including critical care time) Labs Review Labs Reviewed - No data to display  Imaging Review Dg Hand Complete Right  02/05/2015   CLINICAL DATA:  Possible piece of metal under the skin, while working with metal. Pain and swelling at the distal tip of the third finger of the right hand. Initial encounter.  EXAM: RIGHT HAND - COMPLETE 3+ VIEW  COMPARISON:  None.  FINDINGS: No radiopaque foreign bodies are seen. No definite soft tissue abnormalities are characterized on radiograph. Visualized joint spaces are preserved. There is no evidence of fracture or dislocation. The carpal rows appear grossly intact, and demonstrate normal alignment.  IMPRESSION: No radiopaque foreign bodies seen. No evidence of fracture or dislocation.   Electronically Signed   By: Roanna Raider M.D.   On: 02/05/2015 21:29   I, Doug Sou, personally reviewed and evaluated these  images and lab results as part of my medical decision-making.   EKG Interpretation None     No foreign body seen on x-ray. X-ray viewed by me Results for orders placed or performed during the hospital encounter of 03/04/14  Comprehensive metabolic panel  Result Value Ref Range   Sodium 140 137 - 147 mEq/L   Potassium 4.0 3.7 - 5.3 mEq/L   Chloride 101 96 -  112 mEq/L   CO2 28 19 - 32 mEq/L   Glucose, Bld 91 70 - 99 mg/dL   BUN 13 6 - 23 mg/dL   Creatinine, Ser 4.09 0.50 - 1.35 mg/dL   Calcium 9.6 8.4 - 81.1 mg/dL   Total Protein 7.0 6.0 - 8.3 g/dL   Albumin 4.1 3.5 - 5.2 g/dL   AST 16 0 - 37 U/L   ALT 11 0 - 53 U/L   Alkaline Phosphatase 48 39 - 117 U/L   Total Bilirubin 0.5 0.3 - 1.2 mg/dL   GFR calc non Af Amer 82 (L) >90 mL/min   GFR calc Af Amer >90 >90 mL/min   Anion gap 11 5 - 15  CBC with Differential  Result Value Ref Range   WBC 9.2 4.0 - 10.5 K/uL   RBC 4.91 4.22 - 5.81 MIL/uL   Hemoglobin 13.5 13.0 - 17.0 g/dL   HCT 91.4 78.2 - 95.6 %   MCV 80.2 78.0 - 100.0 fL   MCH 27.5 26.0 - 34.0 pg   MCHC 34.3 30.0 - 36.0 g/dL   RDW 21.3 08.6 - 57.8 %   Platelets 252 150 - 400 K/uL   Neutrophils Relative % 71 43 - 77 %   Neutro Abs 6.6 1.7 - 7.7 K/uL   Lymphocytes Relative 21 12 - 46 %   Lymphs Abs 2.0 0.7 - 4.0 K/uL   Monocytes Relative 7 3 - 12 %   Monocytes Absolute 0.6 0.1 - 1.0 K/uL   Eosinophils Relative 1 0 - 5 %   Eosinophils Absolute 0.1 0.0 - 0.7 K/uL   Basophils Relative 0 0 - 1 %   Basophils Absolute 0.0 0.0 - 0.1 K/uL   Dg Hand Complete Right  02/05/2015   CLINICAL DATA:  Possible piece of metal under the skin, while working with metal. Pain and swelling at the distal tip of the third finger of the right hand. Initial encounter.  EXAM: RIGHT HAND - COMPLETE 3+ VIEW  COMPARISON:  None.  FINDINGS: No radiopaque foreign bodies are seen. No definite soft tissue abnormalities are characterized on radiograph. Visualized joint spaces are preserved. There is no evidence of fracture or dislocation. The carpal rows appear grossly intact, and demonstrate normal alignment.  IMPRESSION: No radiopaque foreign bodies seen. No evidence of fracture or dislocation.   Electronically Signed   By: Roanna Raider M.D.   On: 02/05/2015 21:29    MDM  exam fairly unremarkable. Plan Advil, warm soaks, referral Dr. Melvyn Novas Final diagnoses:   None   Diagnosis pain in right middle finger     Doug Sou, MD 02/05/15 2312

## 2015-02-05 NOTE — ED Notes (Signed)
?   metal in rt middle finger 1-2 weeks ago,  Slight swelling  Increased pain

## 2015-02-05 NOTE — ED Notes (Signed)
2 weeks ago he was working with metal and thinks he got a piece under his skin. Pain and swelling to his right middle finger. His hand aches.

## 2015-02-10 ENCOUNTER — Emergency Department (HOSPITAL_BASED_OUTPATIENT_CLINIC_OR_DEPARTMENT_OTHER)
Admission: EM | Admit: 2015-02-10 | Discharge: 2015-02-10 | Disposition: A | Discharge status: Home / Self Care | Payer: BLUE CROSS/BLUE SHIELD | Attending: Emergency Medicine | Admitting: Emergency Medicine

## 2015-02-10 ENCOUNTER — Encounter (HOSPITAL_BASED_OUTPATIENT_CLINIC_OR_DEPARTMENT_OTHER): Payer: Self-pay

## 2015-02-10 DIAGNOSIS — L03011 Cellulitis of right finger: Secondary | ICD-10-CM | POA: Diagnosis not present

## 2015-02-10 DIAGNOSIS — Z720 Tobacco use: Secondary | ICD-10-CM | POA: Diagnosis not present

## 2015-02-10 DIAGNOSIS — Z8619 Personal history of other infectious and parasitic diseases: Secondary | ICD-10-CM | POA: Diagnosis not present

## 2015-02-10 DIAGNOSIS — M79641 Pain in right hand: Secondary | ICD-10-CM | POA: Diagnosis present

## 2015-02-10 MED ORDER — LIDOCAINE HCL (PF) 1 % IJ SOLN
2.0000 mL | Freq: Once | INTRAMUSCULAR | Status: AC
  Administered 2015-02-10: 2 mL
  Filled 2015-02-10: qty 5

## 2015-02-10 MED ORDER — OXYCODONE-ACETAMINOPHEN 5-325 MG PO TABS
1.0000 | ORAL_TABLET | Freq: Once | ORAL | Status: AC
  Administered 2015-02-10: 1 via ORAL
  Filled 2015-02-10: qty 1

## 2015-02-10 MED ORDER — CEPHALEXIN 500 MG PO CAPS: 500.0000 mg | ORAL_CAPSULE | Freq: Four times a day (QID) | ORAL | Status: AC

## 2015-02-10 NOTE — ED Notes (Signed)
C/o pain, swelling to right middle finger x 2 weeks-denies injury-seen here last week for same

## 2015-02-10 NOTE — ED Provider Notes (Signed)
CSN: [id number]     Arrival date & time 02/10/15  2013 History   First MD Initiated Contact with Patient 02/10/15 2032     Chief Complaint  Patient presents with  . Hand Pain     (Consider location/radiation/quality/duration/timing/severity/associated sxs/prior Treatment) Patient is a 39 y.o. male presenting with hand pain. The history is provided by the patient. No language interpreter was used.  Hand Pain Pertinent negatives include no fever.  Mr. Stephen Willis is a 39 year old male who presents for right middle finger swelling and pain that began 2 weeks ago. He states he originally thought he had a piece of metal stuck in there but had an x-ray done 5 days ago that was negative. He admits to pulling a piece of dead skin off of the finger but denies biting his nails or cuticles. He denies any fever, chills. He is right-handed. He denies any trauma or injury.  Past Medical History  Diagnosis Date  . Whooping cough    History reviewed. No pertinent past surgical history. No family history on file. Social History  Substance Use Topics  . Smoking status: Current Every Day Smoker  . Smokeless tobacco: None  . Alcohol Use: Yes     Comment: social    Review of Systems  Constitutional: Negative for fever.  Skin: Positive for wound.      Allergies  Review of patient's allergies indicates no known allergies.  Home Medications   Prior to Admission medications   Medication Sig Start Date End Date Taking? Authorizing Provider  TRAMADOL HCL PO Take by mouth.   Yes Historical Provider, MD  cephALEXin (KEFLEX) 500 MG capsule Take 1 capsule (500 mg total) by mouth 4 (four) times daily. 02/10/15   Jaye Polidori Patel-Mills, PA-C   BP 118/70 mmHg  Pulse 80  Temp(Src) 98.2 F (36.8 C) (Oral)  Resp 16  Ht 5\' 10"  (1.778 m)  Wt 161 lb (73.029 kg)  BMI 23.10 kg/m2  SpO2 100% Physical Exam  Constitutional: He is oriented to person, place, and time. He appears well-developed and well-nourished.   HENT:  Head: Normocephalic.  Eyes: Conjunctivae are normal.  Neck: Neck supple.  Cardiovascular: Normal rate.   Pulmonary/Chest: Effort normal.  Musculoskeletal: Normal range of motion.  Right hand: Able to flex and extend fingers. 2+ radial pulse. Distal Right middle finger is swollen and tender surrounding the base of the nail.    Neurological: He is alert and oriented to person, place, and time.  Skin: Skin is warm and dry.  Psychiatric: He has a normal mood and affect. His behavior is normal.  Nursing note and vitals reviewed.   ED Course  Procedures (including critical care time) Labs Review Labs Reviewed - No data to display INCISION AND DRAINAGE Performed by: Catha Gosselin Consent: Verbal consent obtained. Risks and benefits: risks, benefits and alternatives were discussed Type: abscess Body area: distal right middle finger Anesthesia: local infiltration Incision was made with a scalpel. Local anesthetic: lidocaine 1% w/o epinephrine Anesthetic total: .5 ml Complexity: simple Drainage: purulent and bloody Drainage amount: 1cc Patient tolerance: Patient tolerated the procedure well with no immediate complications. A 1cm incision was made near the left side of the nail bed. Bleeding controlled.  Wound was wrapped in gauze and coban.    Imaging Review No results found.    EKG Interpretation None      MDM   Final diagnoses:  Paronychia, right  He is afebrile. I was able to drain the wound. I discussed applying  warm compresses to the area. He can take tylenol or motrin for pain.  I put the patient on keflex and gave him return precautions.   Medications  lidocaine (PF) (XYLOCAINE) 1 % injection 2 mL (2 mLs Infiltration Given 02/10/15 2059)  oxyCODONE-acetaminophen (PERCOCET/ROXICET) 5-325 MG per tablet 1 tablet (1 tablet Oral Given 02/10/15 2119)     Catha Gosselin, PA-C 02/10/15 2138  Benjiman Core, MD 02/10/15 2322

## 2015-02-10 NOTE — Discharge Instructions (Signed)
Paronychia  Take antibiotics as prescribed.  Return for increased swelling or fever.  Paronychia is an infection of the skin caused by germs. It happens by the fingernail or toenail. You can avoid it by not:  Pulling on hangnails.  Nail biting.  Thumb sucking.  Cutting fingernails and toenails too short.  Cutting the skin at the base and sides of the fingernail or toenail (cuticle). HOME CARE  Keep the fingers or toes very dry. Put rubber gloves over cotton gloves when putting hands in water.  Keep the wound clean and bandaged (dressed) as told by your doctor.  Soak the fingers or toes in warm water for 15 to 20 minutes. Soak them 3 to 4 times per day for germ infections. Fungal infections are difficult to treat. Fungal infections often require treatment for a long time.  Only take medicine as told by your doctor. GET HELP RIGHT AWAY IF:   You have redness, puffiness (swelling), or pain that gets worse.  You see yellowish-white fluid (pus) coming from the wound.  You have a fever.  You have a bad smell coming from the wound or bandage. MAKE SURE YOU:  Understand these instructions.  Will watch your condition.  Will get help if you are not doing well or get worse. Document Released: 05/31/2009 Document Revised: 09/04/2011 Document Reviewed: 05/31/2009 [facility] Patient Information 2015 [place], [place]. This information is not intended to replace advice given to you by your health care provider. Make sure you discuss any questions you have with your health care provider.  Emergency Department Resource Guide 1) Find a Doctor and Pay Out of Pocket Although you won't have to find out who is covered by your insurance plan, it is a good idea to ask around and get recommendations. You will then need to call the office and see if the doctor you have chosen will accept you as a new patient and what types of options they offer for patients who are self-pay. Some doctors offer discounts  or will set up payment plans for their patients who do not have insurance, but you will need to ask so you aren't surprised when you get to your appointment.  2) Contact Your Local Health Department Not all health departments have doctors that can see patients for sick visits, but many do, so it is worth a call to see if yours does. If you don't know where your local health department is, you can check in your phone book. The CDC also has a tool to help you locate your state's health department, and many state websites also have listings of all of their local health departments.  3) Find a Walk-in Clinic If your illness is not likely to be very severe or complicated, you may want to try a walk in clinic. These are popping up all over the country in pharmacies, drugstores, and shopping centers. They're usually staffed by nurse practitioners or physician assistants that have been trained to treat common illnesses and complaints. They're usually fairly quick and inexpensive. However, if you have serious medical issues or chronic medical problems, these are probably not your best option.  No Primary Care Doctor: - Call Health Connect at  [phone] - they can help you locate a primary care doctor that  accepts your insurance, provides certain services, etc. - Physician Referral Service- [phone]  Chronic Pain Problems: Organization         Address  Phone   Notes  [facility]  [phone] Patients need to be referred by their primary care doctor.   Medication Assistance: Organization         Address  Phone   Notes  [facility] Medication [facility] [address] [place] [place], [place] [address] [phone] --Must be a resident of [facility] -- Must have NO insurance coverage whatsoever (no Medicaid/ Medicare, etc.) -- The pt. MUST have a primary care doctor that directs their care regularly and follows them in the community   MedAssist  [phone]   [employer]  [phone]    Agencies that provide inexpensive medical care: Organization         Address  Phone   Notes  [facility]  [phone]   [facility]    [phone]   [facility] [address] [place], [place] [address] [phone]   [facility] [address], [place] [phone]   Planned Parenthood    [phone]   [facility]    [phone]   Community Health and [facility]  [address], [place] Phone:  [phone], Fax:  [phone] Hours of Operation:  9 am - 6 pm, M-F.  Also accepts Medicaid/Medicare and self-pay.  [facility]  [address], [place] Phone: [phone], Fax: [phone]. Hours of Operation:  8:30 am - 5:30 pm, M-F.  Also accepts Medicaid and self-pay.  [facility] [place] [address], [place] Phone: [phone]   [facility] [address] Natasha Bence [place], [place] [phone], Ext. [phone] Mondays & Thursdays: 7-9 AM.  First 15 patients are seen on a first come, first serve basis.    Medicaid-accepting [facility] Providers:  Organization         Address  Phone   Notes  [facility] [address], [place] [phone] Also accepts self-pay patients.  [facility] [address] Laurell Josephs [place], [place]  [phone]   [facility] [address], [place] [phone]   [facility] [address], [place] [phone]   Renaye Rakers [address], [place]   [phone] Only accepts [place] Access [place] patients after they have their name applied to their card.   Self-Pay (no insurance) in [facility]:  Organization         Address  Phone   Notes  Sickle Cell Patients, [facility] [address]  [place], [place] [phone]   [facility] [address] [place], [place] [phone]   [facility] [place]  [address], [place] [phone]   [facility]/Dr. Osei-Bonsu  [address] [place] or [address], [place] [phone] Phone number for both [place] and [place] locations is the same.  [facility]gent Medical and [facility] [address] [place] [phone]   [facility] [address] [place] or [address] [phone]   [facility] [address], [place] [phone], phone; [phone], fax Sees patients 1st and 3rd Saturday of every month.  Must not qualify for public or private insurance (i.e. Medicaid, Medicare, [place] Health Choice, Veterans' Benefits)  Household income should be no more than 200% of the poverty level The clinic cannot treat you if you are pregnant or think you are pregnant  Sexually transmitted diseases are not treated at the clinic.    Dental Care: Organization         Address  Phone  Notes  [facility] [address], [place] [phone] Accepts children up to age 41 who are enrolled in [place] or [place] Health Choice; pregnant women with a Medicaid card; and children who have applied for Medicaid or [place] Health Choice, but were declined, whose parents can pay a reduced fee at time of service.  [facility] [place]  [address], [place] [phone] Accepts children up to age 41 who are enrolled in [place] or [place] Health Choice; pregnant women with a Medicaid card; and children who have applied for Medicaid or [place] Health Choice, but were declined, whose parents can pay a reduced fee at time of service.  [facility]  [address], [place] [phone]  [phone] Patients are seen by appointment only. Walk-ins are not accepted. [facility] will see patients 41 years of age and older. Monday - Tuesday (8am-5pm) Most Wednesdays (8:30-5pm) $30 per visit, cash only  [facility]  [address], [facility] [phone] Patients are seen by appointment only. Walk-ins are not accepted. [facility] will see patients 918 years of age and older. One Wednesday Evening (Monthly: Volunteer Based).  $30 per visit, cash only  [employer] [facility] [facility]  [phone]) [phone] for adults; Children under age 554, call Graduate Pediatric Dentistry at [phone]. Children aged 284-14, please call [phone]) [phone] to request a pediatric application.  Dental services are provided in all areas of dental care including fillings, crowns and bridges, complete and partial dentures, implants, gum treatment, root canals, and extractions. Preventive care is also provided. Treatment is provided to both adults and children. Patients are selected via a lottery and there is often a waiting list.   [facility] [address], [place]  [phone] [email or web address]   [facility] [address], [place], [place] [phone], Ext. [phone] Second and Fourth Thursday of each month, opens at 6:30 AM; Clinic ends at 9 AM.  Patients are seen on a first-come first-served basis, and a limited number are seen during each clinic.   [facility]  [address], [place], [place] [phone]) [phone]   Eligibility Requirements You must have lived in [place], [place], or [place] counties for at least the last three months.   You cannot be eligible for state or federal sponsored [place] Cityhealthcare insurance, including CIGNAVeterans Administration, [place]icaid, or [employer]icare.   You generally cannot be eligible for healthcare insurance through your employer.    How to apply: Eligibility screenings are held every Tuesday and Wednesday afternoon  from 1:00 pm until 4:00 pm. You do not need an appointment for the interview!  [facility] [address], [place], [place] [phone]   [facility]  [phone]   [facility]  [phone]   [facility]  [phone]    Behavioral Health Resources in the Community: Intensive Outpatient Programs Organization         Address  Phone  Notes  [facility] [address], [place],  [place] [phone]   [facility] Outpatient [address], [place], [place]   [facility]: [facility] [address], [place], [place]   [place] [address]  [place], [place] or [phone]   Substance Abuse Resources Organization         Address  Phone  Notes  [facility] Drug Services  [phone]   [place]  [phone]   [facility]   Chinita Pester  [phone]   Residential & Outpatient Substance Abuse Program  [phone]   Psychological Services Organization         Address  Phone  Notes  [facility] [place]  [place]  [phone]   [place] [address], [place] or [phone]    Mobile Crisis Teams Organization         Address  Phone  Notes  [facility], Mobile Crisis Care Unit  [phone]   [facility]  [address] [place], [place]   [place] [address], [place] [phone]    Self-Help/Support Groups Organization         Address  Phone             Notes  [place] of [place] - variety of support groups  [place] Call for more information  Narcotics Anonymous (NA), [facility] [address], [employer] [place]  2 meetings at this location   Materials engineer         Address  Phone  Notes  [facility] [place],    [place]  [phone]   [facility]  [address], [place] [phone], [place], [place]   [place], [place] [phone] Admissions: 8am-3pm M-F  Incentives Substance [place] [address]    [place], [place] [phone]   [facility] [address] [place], [place], [place]   [facility] [address]  [place], [place]   [facility] - Intensive Outpatient [address], [place], [place]   [facility] ([place].,  [place], [place] [phone] or [phone]   [facility] (RTS) [address], [place], [place] Accepts Medicaid  [facility] [place] [address]  [place] [phone] Substance Abuse/Addiction Treatment   [facility] Organization         Address  Phone  Notes  [facility]  [phone]   Domenic Schwab, PhD [address] Arlis Porta [place], [place]   [phone] or [phone]   [facility] [place] [address] [place], [place] [phone]   [facility] [address], [place], [place] [phone] Insurance/Medicaid/sponsorship through [facility] and Families [address]                                    [place], [place] [phone] [place] [address], [place] [phone]    Dr. Adele Schilder  [phone]   [facility] Dept. 1) [address], [place] [address], [place] °3)  [address], [place] [phone] °[phone] ° °[phone]   °[place] Child Abuse Hotline (336) 342-1394 or ([phone] (After Hours)    ° ° ° °

## 2015-02-15 ENCOUNTER — Encounter (HOSPITAL_BASED_OUTPATIENT_CLINIC_OR_DEPARTMENT_OTHER): Payer: Self-pay

## 2015-02-15 ENCOUNTER — Emergency Department (HOSPITAL_BASED_OUTPATIENT_CLINIC_OR_DEPARTMENT_OTHER)
Admission: EM | Admit: 2015-02-15 | Discharge: 2015-02-15 | Disposition: A | Discharge status: Home / Self Care | Payer: BLUE CROSS/BLUE SHIELD | Attending: Emergency Medicine | Admitting: Emergency Medicine

## 2015-02-15 DIAGNOSIS — L02511 Cutaneous abscess of right hand: Secondary | ICD-10-CM | POA: Diagnosis not present

## 2015-02-15 DIAGNOSIS — Z720 Tobacco use: Secondary | ICD-10-CM | POA: Insufficient documentation

## 2015-02-15 DIAGNOSIS — M79641 Pain in right hand: Secondary | ICD-10-CM | POA: Diagnosis present

## 2015-02-15 MED ORDER — LIDOCAINE HCL 2 % IJ SOLN
10.0000 mL | Freq: Once | INTRAMUSCULAR | Status: DC
  Filled 2015-02-15: qty 20

## 2015-02-15 MED ORDER — SULFAMETHOXAZOLE-TRIMETHOPRIM 800-160 MG PO TABS: 1.0000 | ORAL_TABLET | Freq: Two times a day (BID) | ORAL | Status: AC

## 2015-02-15 NOTE — Discharge Instructions (Signed)

## 2015-02-15 NOTE — ED Provider Notes (Signed)
CSN: [id number]     Arrival date & time 02/15/15  2055 History   First MD Initiated Contact with Patient 02/15/15 2107     Chief Complaint  Patient presents with  . Hand Pain     (Consider location/radiation/quality/duration/timing/severity/associated sxs/prior Treatment) HPI Comments: Pt comes in with c/o right middle finger pain. He states that he had the area drained 5 days ago and has been taking cephalexin. He states that it is a little better but not completely. Denies redness or drainage from the area  The history is provided by the patient. No language interpreter was used.    Past Medical History  Diagnosis Date  . Whooping cough    History reviewed. No pertinent past surgical history. No family history on file. Social History  Substance Use Topics  . Smoking status: Current Every Day Smoker  . Smokeless tobacco: None  . Alcohol Use: Yes     Comment: social    Review of Systems  All other systems reviewed and are negative.     Allergies  Review of patient's allergies indicates no known allergies.  Home Medications   Prior to Admission medications   Medication Sig Start Date End Date Taking? Authorizing Provider  cephALEXin (KEFLEX) 500 MG capsule Take 1 capsule (500 mg total) by mouth 4 (four) times daily. 02/10/15   Hanna Patel-Mills, PA-C  TRAMADOL HCL PO Take by mouth.    Historical Provider, MD   BP 123/73 mmHg  Pulse 88  Temp(Src) 98.2 F (36.8 C) (Oral)  Resp 16  Ht  (1.778 m)  Wt 160 lb (72.576 kg)  BMI 22.96 kg/m2  SpO2 100% Physical Exam  Constitutional: He is oriented to person, place, and time. He appears well-developed and well-nourished.  Cardiovascular: Normal rate and regular rhythm.   Pulmonary/Chest: Effort normal.  Musculoskeletal: Normal range of motion.  Neurological: He is alert and oriented to person, place, and time.  Skin:  Swelling noted to the medial right middle finger.fluctance and scar noted from previous incision   Nursing note and vitals reviewed.   ED Course  INCISION AND DRAINAGE Date/Time: 02/15/2015 10:36 PM Performed by: Teressa Lower Authorized by: Teressa Lower Consent: Verbal consent obtained. Consent given by: patient Patient identity confirmed: verbally with patient Type: abscess Body area: upper extremity Location details: right long finger Anesthesia: digital block Local anesthetic: lidocaine 2% without epinephrine Scalpel size: 11 Complexity: simple Drainage: purulent Drainage amount: moderate Patient tolerance: Patient tolerated the procedure well with no immediate complications   (including critical care time) Labs Review Labs Reviewed - No data to display  Imaging Review No results found. I have personally reviewed and evaluated these images and lab results as part of my medical decision-making.   EKG Interpretation None      MDM   Final diagnoses:  Abscess of finger, right    Wound drained with large amount of drainage. Not consistent with a felon. Pt feeling better after drainage. Will add bactrim    Teressa Lower, NP 02/15/15 1914  Benjiman Core, MD 02/15/15 2259

## 2015-02-15 NOTE — ED Notes (Signed)
Pt c/o right middle finger pain, was seen last week and had it drained, states he feels like it needs to be drained again, verbalizes compliance with abx treatment

## 2015-08-28 IMAGING — CR DG HAND COMPLETE 3+V*R*
3 series · 3 of 3 positions shown · non-contrast
Comparison: None.

CLINICAL DATA: Possible piece of metal under the skin, while
working with metal. Pain and swelling at the distal tip of the third
finger of the right hand. Initial encounter.

EXAM:
RIGHT HAND - COMPLETE 3+ VIEW

[x hand pa right]
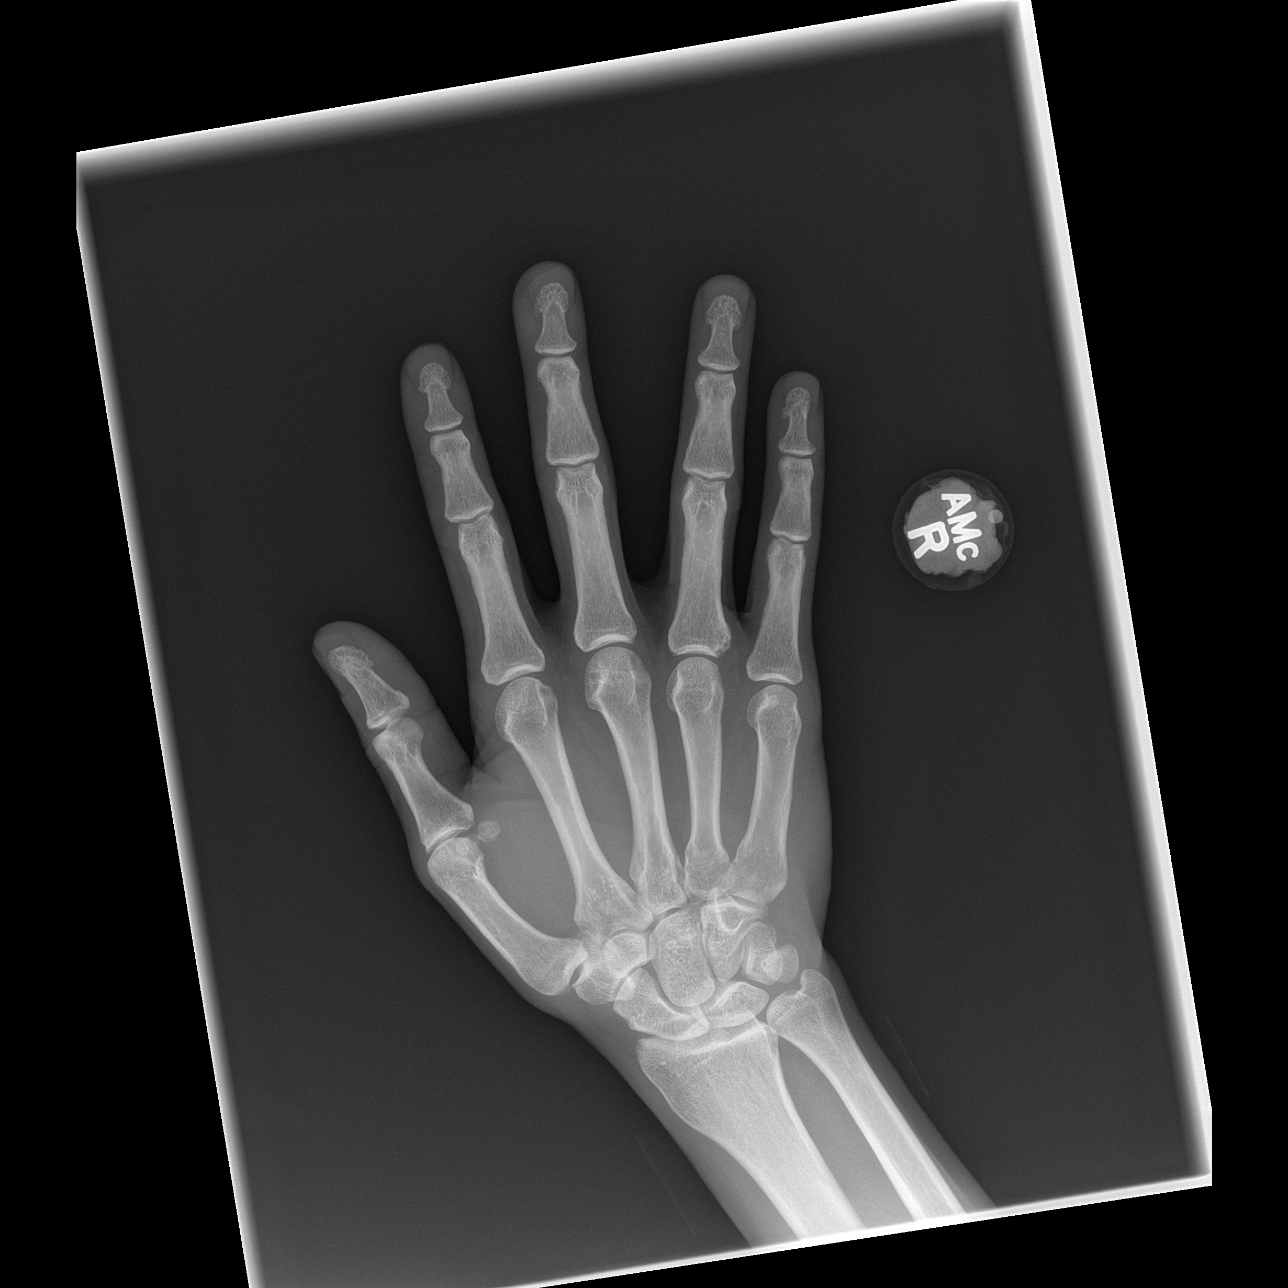

[x hand oblique right]
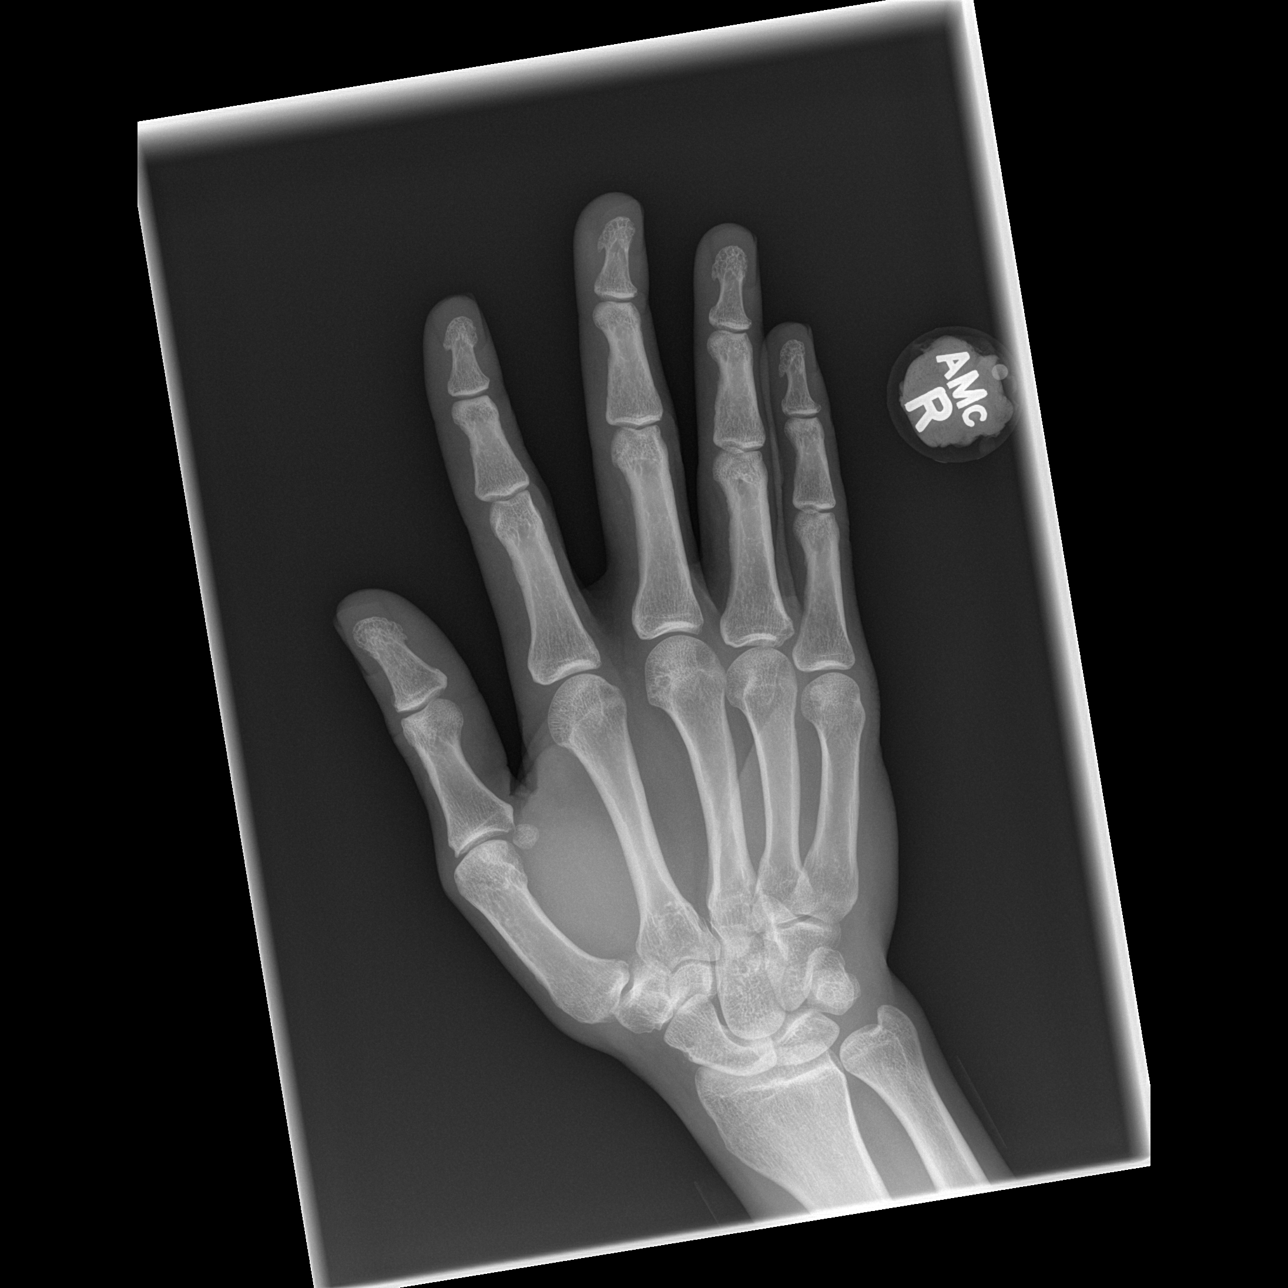

[x hand lat right]
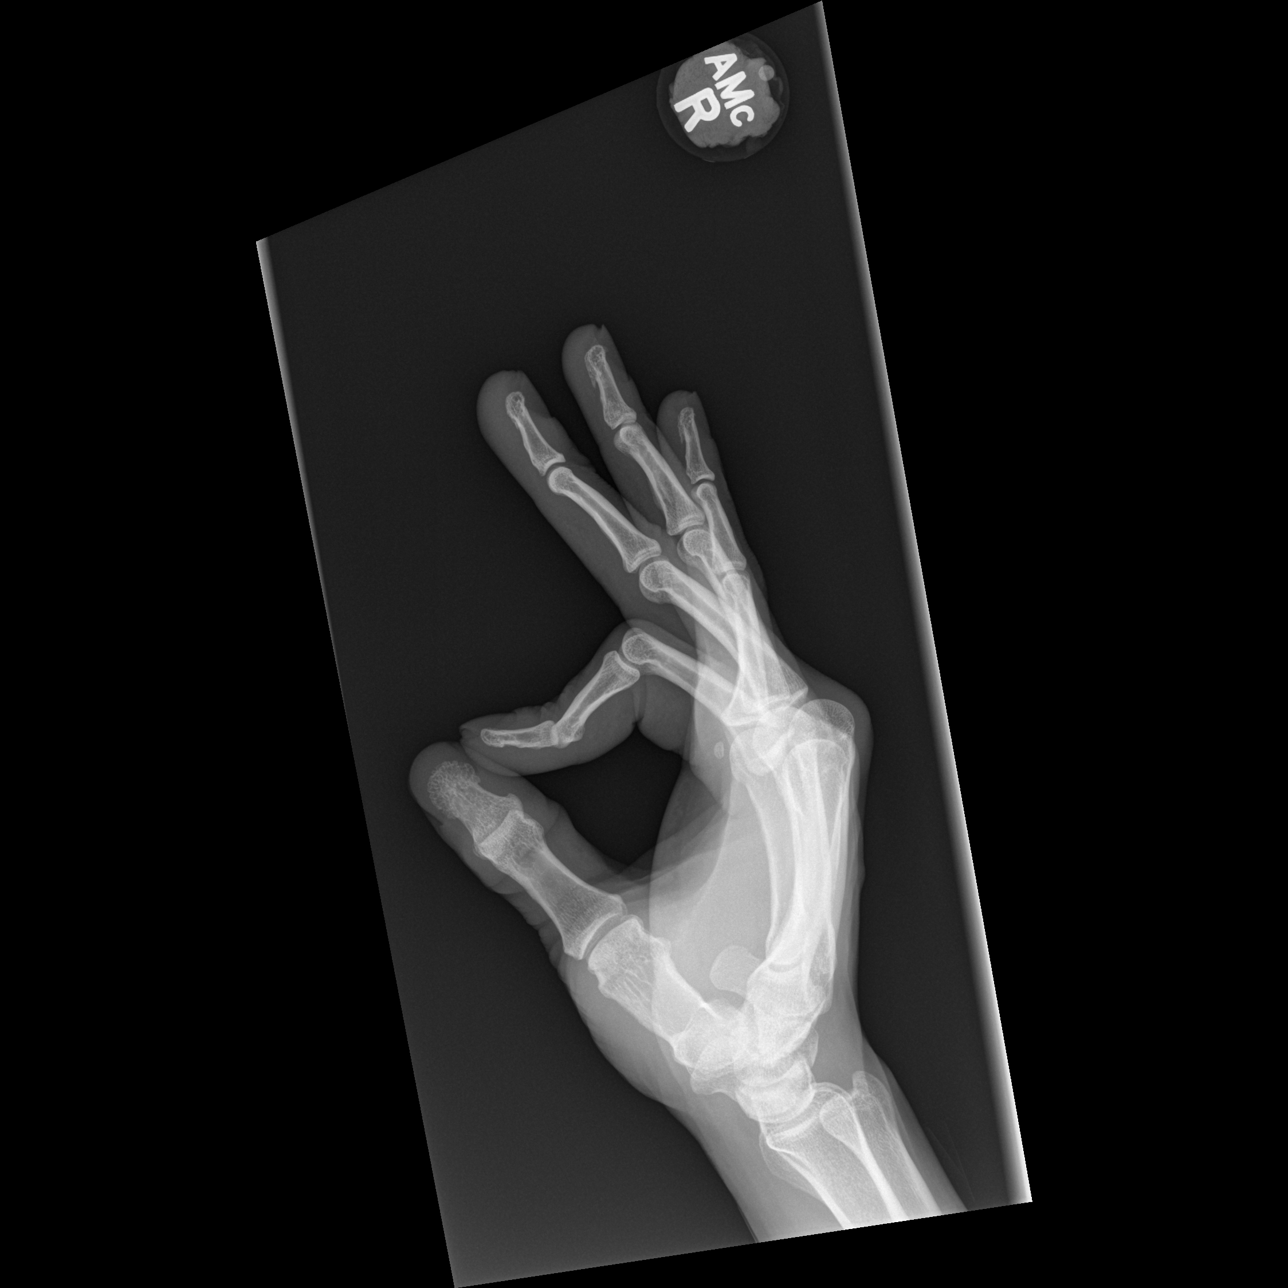

[3 of 3 positions shown; findings below may reference images not displayed]

FINDINGS: No radiopaque foreign bodies are seen. No definite soft tissue
abnormalities are characterized on radiograph. Visualized joint
spaces are preserved. There is no evidence of fracture or
dislocation. The carpal rows appear grossly intact, and demonstrate
normal alignment.
IMPRESSION: No radiopaque foreign bodies seen. No evidence of fracture or
dislocation.
